# Patient Record
Sex: Female | Born: 1960 | Race: White | Hispanic: No | Marital: Married | Smoking: Current every day smoker
Health system: Southern US, Community
[De-identification: ages and names within clinical notes are randomized; demographics above are authoritative.]

## PROBLEM LIST (undated history)

## (undated) DIAGNOSIS — K219 Gastro-esophageal reflux disease without esophagitis: Secondary | ICD-10-CM

## (undated) DIAGNOSIS — I8390 Asymptomatic varicose veins of unspecified lower extremity: Secondary | ICD-10-CM

## (undated) DIAGNOSIS — I83813 Varicose veins of bilateral lower extremities with pain: Secondary | ICD-10-CM

## (undated) HISTORY — DX: Varicose veins of bilateral lower extremities with pain: I83.813

## (undated) HISTORY — DX: Gastro-esophageal reflux disease without esophagitis: K21.9

## (undated) HISTORY — DX: Asymptomatic varicose veins of unspecified lower extremity: I83.90

---

## 1984-10-27 HISTORY — PX: TUBAL LIGATION: SHX77

## 2000-10-27 HISTORY — PX: BREAST BIOPSY: SHX20

## 2005-02-18 ENCOUNTER — Encounter: Admission: RE | Admit: 2005-02-18 | Discharge: 2005-02-18 | Payer: Self-pay | Admitting: Family Medicine

## 2013-10-06 HISTORY — PX: COLONOSCOPY: SHX174

## 2015-05-09 ENCOUNTER — Other Ambulatory Visit: Payer: Self-pay | Admitting: *Deleted

## 2015-05-09 DIAGNOSIS — I83813 Varicose veins of bilateral lower extremities with pain: Secondary | ICD-10-CM

## 2015-06-19 DIAGNOSIS — I83813 Varicose veins of bilateral lower extremities with pain: Secondary | ICD-10-CM | POA: Insufficient documentation

## 2015-08-09 ENCOUNTER — Encounter: Payer: Self-pay | Admitting: Vascular Surgery

## 2015-08-13 ENCOUNTER — Ambulatory Visit (HOSPITAL_COMMUNITY)
Admission: RE | Admit: 2015-08-13 | Discharge: 2015-08-13 | Disposition: A | Discharge status: Home / Self Care | Payer: BLUE CROSS/BLUE SHIELD | Source: Ambulatory Visit | Attending: Vascular Surgery | Admitting: Vascular Surgery

## 2015-08-13 ENCOUNTER — Encounter: Payer: Self-pay | Admitting: Vascular Surgery

## 2015-08-13 ENCOUNTER — Ambulatory Visit (INDEPENDENT_AMBULATORY_CARE_PROVIDER_SITE_OTHER): Payer: BLUE CROSS/BLUE SHIELD | Admitting: Vascular Surgery

## 2015-08-13 VITALS — BP 125/81 | HR 68 | Temp 97.8°F | Resp 14 | Ht 64.0 in | Wt 131.0 lb

## 2015-08-13 DIAGNOSIS — I8393 Asymptomatic varicose veins of bilateral lower extremities: Secondary | ICD-10-CM | POA: Diagnosis not present

## 2015-08-13 DIAGNOSIS — I83813 Varicose veins of bilateral lower extremities with pain: Secondary | ICD-10-CM | POA: Insufficient documentation

## 2015-08-13 NOTE — Progress Notes (Signed)
Subjective:     Patient ID: Victoria Blevins, female   DOB: 1961-02-14, 54 y.o.   MRN: [id number]  HPI this 54 year old female is evaluated for bilateral varicose veins left worse than right. She describes restless legs at night where she cannot get comfortable. She does not have discomfort when she is walking or during the daytime. She denies history of DVT, thrombophlebitis, stasis ulcers, or bleeding. She has had some small spider veins injected in the past with good result in 1999. She does not whirl elastic compression stockings. She is able to ambulate long distances without difficulty. Left leg is slightly worse than the right.  Past Medical History  Diagnosis Date  . GERD (gastroesophageal reflux disease)   . Varicose veins of both lower extremities with pain   . Varicose veins     Social History  Substance Use Topics  . Smoking status: Current Every Day Smoker    Types: Cigarettes  . Smokeless tobacco: Never Used  . Alcohol Use: No    Family History  Problem Relation Age of Onset  . Heart attack Father 4260  . Diabetes Mother   . Arthritis Mother   . Hypertension Mother     No Known Allergies   Current outpatient prescriptions:  .  norethindrone (AYGESTIN) 5 MG tablet, Take 5 mg by mouth daily., Disp: , Rfl:  .  ranitidine (ZANTAC) 150 MG tablet, Take 150 mg by mouth 2 (two) times daily., Disp: , Rfl:   Filed Vitals:   08/13/15 1421  BP: 125/81  Pulse: 68  Temp: 97.8 F (36.6 C)  Resp: 14  Height: 5\' 4"  (1.626 m)  Weight: 131 lb (59.421 kg)  SpO2: 98%    Body mass index is 22.47 kg/(m^2).           Review of Systems denies chest pain, dyspnea on exertion, PND, orthopnea, claudication     Objective:   Physical Exam BP 125/81 mmHg  Pulse 68  Temp(Src) 97.8 F (36.6 C)  Resp 14  Ht 5\' 4"  (1.626 m)  Wt 131 lb (59.421 kg)  BMI 22.47 kg/m2  SpO2 98%  Gen.-alert and oriented x3 in no apparent distress HEENT normal for age Lungs no rhonchi or  wheezing Cardiovascular regular rhythm no murmurs carotid pulses 3+ palpable no bruits audible Abdomen soft nontender no palpable masses Musculoskeletal free of  major deformities Skin clear -no rashes Neurologic normal Lower extremities 3+ femoral and dorsalis pedis pulses palpable bilaterally with no edema A few small spider veins noted in the posterior calf and distal thigh areas left worse than right. No hyperpigmentation or ulceration noted no bulging varicosities noted.  Today I ordered bilateral venous duplex exam which reviewed and interpreted. There is no DVT. There is some reflux in the great saphenous vein bilaterally in the mid thigh and some at the saphenofemoral junction on the right. There is no DVT and there is some bilateral deep vein reflux       Assessment:     #1 bilateral spider veins-mild #2 no evidence of significant gross reflux in bilateral great saphenous veins #3 nocturnal leg discomfort-etiology unknown    Plan:     Have discussed possible sclerotherapy of the spiders veins if the patient desires. Have told her that her symptoms are not related to her arterial or venous disease No indication for any significant intervention and arterial or venous system.

## 2015-10-04 ENCOUNTER — Encounter: Payer: Self-pay | Admitting: *Deleted

## 2015-10-10 ENCOUNTER — Ambulatory Visit (INDEPENDENT_AMBULATORY_CARE_PROVIDER_SITE_OTHER): Payer: BLUE CROSS/BLUE SHIELD | Admitting: *Deleted

## 2015-10-10 DIAGNOSIS — I83893 Varicose veins of bilateral lower extremities with other complications: Secondary | ICD-10-CM

## 2015-10-10 NOTE — Progress Notes (Signed)
X=.3% Sotradecol administered with a 27g butterfly.  Patient received a total of 6cc.  Pt only wanted me to use one syringe. Probably treated 30%. Suspect there is reflux-discussed this with her-she wanted to proceed with sclero. Easy access. Tol well. Follow prn.  Photos: Yes.    Compression stockings applied: Yes.

## 2015-10-15 ENCOUNTER — Encounter: Payer: Self-pay | Admitting: Vascular Surgery

## 2016-12-25 DIAGNOSIS — Z0001 Encounter for general adult medical examination with abnormal findings: Secondary | ICD-10-CM | POA: Diagnosis not present

## 2016-12-29 DIAGNOSIS — Z0000 Encounter for general adult medical examination without abnormal findings: Secondary | ICD-10-CM | POA: Diagnosis not present

## 2016-12-29 DIAGNOSIS — Z716 Tobacco abuse counseling: Secondary | ICD-10-CM | POA: Diagnosis not present

## 2016-12-29 DIAGNOSIS — Z23 Encounter for immunization: Secondary | ICD-10-CM | POA: Diagnosis not present

## 2017-03-03 DIAGNOSIS — Z23 Encounter for immunization: Secondary | ICD-10-CM | POA: Diagnosis not present

## 2017-03-17 DIAGNOSIS — Z1231 Encounter for screening mammogram for malignant neoplasm of breast: Secondary | ICD-10-CM | POA: Diagnosis not present

## 2017-12-10 DIAGNOSIS — J0101 Acute recurrent maxillary sinusitis: Secondary | ICD-10-CM | POA: Diagnosis not present

## 2017-12-10 DIAGNOSIS — Z6824 Body mass index (BMI) 24.0-24.9, adult: Secondary | ICD-10-CM | POA: Diagnosis not present

## 2017-12-28 DIAGNOSIS — Z0000 Encounter for general adult medical examination without abnormal findings: Secondary | ICD-10-CM | POA: Diagnosis not present

## 2018-01-04 DIAGNOSIS — Z23 Encounter for immunization: Secondary | ICD-10-CM | POA: Diagnosis not present

## 2018-01-04 DIAGNOSIS — E782 Mixed hyperlipidemia: Secondary | ICD-10-CM | POA: Diagnosis not present

## 2018-01-04 DIAGNOSIS — Z9189 Other specified personal risk factors, not elsewhere classified: Secondary | ICD-10-CM | POA: Diagnosis not present

## 2018-01-04 DIAGNOSIS — Z6825 Body mass index (BMI) 25.0-25.9, adult: Secondary | ICD-10-CM | POA: Diagnosis not present

## 2018-01-04 DIAGNOSIS — G2581 Restless legs syndrome: Secondary | ICD-10-CM | POA: Diagnosis not present

## 2018-01-04 DIAGNOSIS — Z0001 Encounter for general adult medical examination with abnormal findings: Secondary | ICD-10-CM | POA: Diagnosis not present

## 2018-01-04 DIAGNOSIS — F17210 Nicotine dependence, cigarettes, uncomplicated: Secondary | ICD-10-CM | POA: Diagnosis not present

## 2018-01-04 DIAGNOSIS — Z1212 Encounter for screening for malignant neoplasm of rectum: Secondary | ICD-10-CM | POA: Diagnosis not present

## 2018-01-04 DIAGNOSIS — K219 Gastro-esophageal reflux disease without esophagitis: Secondary | ICD-10-CM | POA: Diagnosis not present

## 2018-06-14 DIAGNOSIS — F17210 Nicotine dependence, cigarettes, uncomplicated: Secondary | ICD-10-CM | POA: Diagnosis not present

## 2018-06-14 DIAGNOSIS — Z1389 Encounter for screening for other disorder: Secondary | ICD-10-CM | POA: Diagnosis not present

## 2018-06-14 DIAGNOSIS — E782 Mixed hyperlipidemia: Secondary | ICD-10-CM | POA: Diagnosis not present

## 2018-06-14 DIAGNOSIS — Z1331 Encounter for screening for depression: Secondary | ICD-10-CM | POA: Diagnosis not present

## 2018-06-14 DIAGNOSIS — K219 Gastro-esophageal reflux disease without esophagitis: Secondary | ICD-10-CM | POA: Diagnosis not present

## 2018-06-17 DIAGNOSIS — Z1231 Encounter for screening mammogram for malignant neoplasm of breast: Secondary | ICD-10-CM | POA: Diagnosis not present

## 2018-08-04 DIAGNOSIS — Z23 Encounter for immunization: Secondary | ICD-10-CM | POA: Diagnosis not present

## 2019-01-10 DIAGNOSIS — Z0001 Encounter for general adult medical examination with abnormal findings: Secondary | ICD-10-CM | POA: Diagnosis not present

## 2019-01-11 DIAGNOSIS — Z6824 Body mass index (BMI) 24.0-24.9, adult: Secondary | ICD-10-CM | POA: Diagnosis not present

## 2019-01-11 DIAGNOSIS — Z1212 Encounter for screening for malignant neoplasm of rectum: Secondary | ICD-10-CM | POA: Diagnosis not present

## 2019-01-11 DIAGNOSIS — Z0001 Encounter for general adult medical examination with abnormal findings: Secondary | ICD-10-CM | POA: Diagnosis not present

## 2019-01-11 DIAGNOSIS — F17210 Nicotine dependence, cigarettes, uncomplicated: Secondary | ICD-10-CM | POA: Diagnosis not present

## 2019-01-11 DIAGNOSIS — E782 Mixed hyperlipidemia: Secondary | ICD-10-CM | POA: Diagnosis not present

## 2019-01-11 DIAGNOSIS — K219 Gastro-esophageal reflux disease without esophagitis: Secondary | ICD-10-CM | POA: Diagnosis not present

## 2019-01-11 DIAGNOSIS — M545 Low back pain: Secondary | ICD-10-CM | POA: Diagnosis not present

## 2019-06-20 DIAGNOSIS — Z1231 Encounter for screening mammogram for malignant neoplasm of breast: Secondary | ICD-10-CM | POA: Diagnosis not present

## 2019-09-28 DIAGNOSIS — Z03818 Encounter for observation for suspected exposure to other biological agents ruled out: Secondary | ICD-10-CM | POA: Diagnosis not present

## 2020-12-17 ENCOUNTER — Other Ambulatory Visit (HOSPITAL_COMMUNITY): Payer: Self-pay | Admitting: Family Medicine

## 2020-12-17 DIAGNOSIS — Z1231 Encounter for screening mammogram for malignant neoplasm of breast: Secondary | ICD-10-CM

## 2020-12-31 ENCOUNTER — Other Ambulatory Visit: Payer: Self-pay

## 2020-12-31 ENCOUNTER — Ambulatory Visit (HOSPITAL_COMMUNITY)
Admission: RE | Admit: 2020-12-31 | Discharge: 2020-12-31 | Disposition: A | Discharge status: Home / Self Care | Payer: 59 | Source: Ambulatory Visit | Attending: Family Medicine | Admitting: Family Medicine

## 2020-12-31 DIAGNOSIS — Z1231 Encounter for screening mammogram for malignant neoplasm of breast: Secondary | ICD-10-CM | POA: Insufficient documentation

## 2021-02-28 ENCOUNTER — Other Ambulatory Visit (HOSPITAL_BASED_OUTPATIENT_CLINIC_OR_DEPARTMENT_OTHER): Payer: Self-pay

## 2021-03-19 ENCOUNTER — Other Ambulatory Visit (HOSPITAL_COMMUNITY): Payer: Self-pay | Admitting: Family Medicine

## 2021-03-19 DIAGNOSIS — F17209 Nicotine dependence, unspecified, with unspecified nicotine-induced disorders: Secondary | ICD-10-CM

## 2021-04-02 ENCOUNTER — Ambulatory Visit (HOSPITAL_COMMUNITY)
Admission: RE | Admit: 2021-04-02 | Discharge: 2021-04-02 | Disposition: A | Discharge status: Home / Self Care | Payer: 59 | Source: Ambulatory Visit | Attending: Family Medicine | Admitting: Family Medicine

## 2021-04-02 ENCOUNTER — Other Ambulatory Visit: Payer: Self-pay

## 2021-04-02 DIAGNOSIS — F17209 Nicotine dependence, unspecified, with unspecified nicotine-induced disorders: Secondary | ICD-10-CM | POA: Diagnosis present

## 2021-08-30 ENCOUNTER — Encounter: Payer: Self-pay | Admitting: Internal Medicine

## 2021-11-06 DIAGNOSIS — R69 Illness, unspecified: Secondary | ICD-10-CM | POA: Diagnosis not present

## 2021-11-06 DIAGNOSIS — M545 Low back pain, unspecified: Secondary | ICD-10-CM | POA: Diagnosis not present

## 2021-11-06 DIAGNOSIS — Z6826 Body mass index (BMI) 26.0-26.9, adult: Secondary | ICD-10-CM | POA: Diagnosis not present

## 2022-02-04 DIAGNOSIS — R300 Dysuria: Secondary | ICD-10-CM | POA: Diagnosis not present

## 2022-02-04 DIAGNOSIS — Z6825 Body mass index (BMI) 25.0-25.9, adult: Secondary | ICD-10-CM | POA: Diagnosis not present

## 2022-02-04 DIAGNOSIS — R69 Illness, unspecified: Secondary | ICD-10-CM | POA: Diagnosis not present

## 2022-02-19 ENCOUNTER — Other Ambulatory Visit (HOSPITAL_COMMUNITY): Payer: Self-pay | Admitting: Family Medicine

## 2022-02-19 DIAGNOSIS — Z1231 Encounter for screening mammogram for malignant neoplasm of breast: Secondary | ICD-10-CM

## 2022-02-24 ENCOUNTER — Ambulatory Visit (HOSPITAL_COMMUNITY)
Admission: RE | Admit: 2022-02-24 | Discharge: 2022-02-24 | Disposition: A | Discharge status: Home / Self Care | Payer: 59 | Source: Ambulatory Visit | Attending: Family Medicine | Admitting: Family Medicine

## 2022-02-24 DIAGNOSIS — Z1231 Encounter for screening mammogram for malignant neoplasm of breast: Secondary | ICD-10-CM | POA: Diagnosis not present

## 2022-03-21 DIAGNOSIS — R5383 Other fatigue: Secondary | ICD-10-CM | POA: Diagnosis not present

## 2022-03-21 DIAGNOSIS — K219 Gastro-esophageal reflux disease without esophagitis: Secondary | ICD-10-CM | POA: Diagnosis not present

## 2022-03-21 DIAGNOSIS — E7849 Other hyperlipidemia: Secondary | ICD-10-CM | POA: Diagnosis not present

## 2022-03-21 DIAGNOSIS — E1165 Type 2 diabetes mellitus with hyperglycemia: Secondary | ICD-10-CM | POA: Diagnosis not present

## 2022-03-21 DIAGNOSIS — E782 Mixed hyperlipidemia: Secondary | ICD-10-CM | POA: Diagnosis not present

## 2022-03-21 DIAGNOSIS — I10 Essential (primary) hypertension: Secondary | ICD-10-CM | POA: Diagnosis not present

## 2022-03-21 DIAGNOSIS — E039 Hypothyroidism, unspecified: Secondary | ICD-10-CM | POA: Diagnosis not present

## 2022-03-21 DIAGNOSIS — Z1159 Encounter for screening for other viral diseases: Secondary | ICD-10-CM | POA: Diagnosis not present

## 2022-03-27 DIAGNOSIS — M545 Low back pain, unspecified: Secondary | ICD-10-CM | POA: Diagnosis not present

## 2022-03-27 DIAGNOSIS — K2100 Gastro-esophageal reflux disease with esophagitis, without bleeding: Secondary | ICD-10-CM | POA: Diagnosis not present

## 2022-03-27 DIAGNOSIS — Z1212 Encounter for screening for malignant neoplasm of rectum: Secondary | ICD-10-CM | POA: Diagnosis not present

## 2022-03-27 DIAGNOSIS — G2581 Restless legs syndrome: Secondary | ICD-10-CM | POA: Diagnosis not present

## 2022-03-27 DIAGNOSIS — R69 Illness, unspecified: Secondary | ICD-10-CM | POA: Diagnosis not present

## 2022-03-27 DIAGNOSIS — E782 Mixed hyperlipidemia: Secondary | ICD-10-CM | POA: Diagnosis not present

## 2022-03-27 DIAGNOSIS — Z9189 Other specified personal risk factors, not elsewhere classified: Secondary | ICD-10-CM | POA: Diagnosis not present

## 2022-03-27 DIAGNOSIS — R300 Dysuria: Secondary | ICD-10-CM | POA: Diagnosis not present

## 2022-03-27 DIAGNOSIS — Z23 Encounter for immunization: Secondary | ICD-10-CM | POA: Diagnosis not present

## 2022-03-27 DIAGNOSIS — J449 Chronic obstructive pulmonary disease, unspecified: Secondary | ICD-10-CM | POA: Diagnosis not present

## 2022-03-27 DIAGNOSIS — I700 Atherosclerosis of aorta: Secondary | ICD-10-CM | POA: Diagnosis not present

## 2022-03-27 DIAGNOSIS — Z0001 Encounter for general adult medical examination with abnormal findings: Secondary | ICD-10-CM | POA: Diagnosis not present

## 2022-04-01 ENCOUNTER — Other Ambulatory Visit: Payer: Self-pay | Admitting: Family Medicine

## 2022-04-01 DIAGNOSIS — F17209 Nicotine dependence, unspecified, with unspecified nicotine-induced disorders: Secondary | ICD-10-CM

## 2022-07-31 ENCOUNTER — Other Ambulatory Visit: Payer: Self-pay

## 2022-07-31 DIAGNOSIS — I83813 Varicose veins of bilateral lower extremities with pain: Secondary | ICD-10-CM

## 2022-08-05 ENCOUNTER — Other Ambulatory Visit: Payer: Self-pay | Admitting: *Deleted

## 2022-08-05 DIAGNOSIS — I83813 Varicose veins of bilateral lower extremities with pain: Secondary | ICD-10-CM

## 2022-08-13 ENCOUNTER — Ambulatory Visit: Payer: 59 | Admitting: Physician Assistant

## 2022-08-13 ENCOUNTER — Ambulatory Visit (HOSPITAL_COMMUNITY)
Admission: RE | Admit: 2022-08-13 | Discharge: 2022-08-13 | Disposition: A | Discharge status: Home / Self Care | Payer: 59 | Source: Ambulatory Visit | Attending: Vascular Surgery | Admitting: Vascular Surgery

## 2022-08-13 ENCOUNTER — Other Ambulatory Visit: Payer: Self-pay

## 2022-08-13 ENCOUNTER — Encounter: Payer: Self-pay | Admitting: Physician Assistant

## 2022-08-13 VITALS — BP 141/73 | HR 61 | Temp 97.6°F | Ht 63.0 in | Wt 149.5 lb

## 2022-08-13 DIAGNOSIS — I83813 Varicose veins of bilateral lower extremities with pain: Secondary | ICD-10-CM | POA: Insufficient documentation

## 2022-08-13 DIAGNOSIS — I872 Venous insufficiency (chronic) (peripheral): Secondary | ICD-10-CM | POA: Diagnosis not present

## 2022-08-13 NOTE — Progress Notes (Addendum)
[facility] [place]     History of Present Illness  Victoria Blevins is a 61 y.o. female who presents with chief complaint: swollen leg.  Patient notes, onset of swelling since 2016, associated with prolonged sitting and standing.  The patient has had no history of DVT, positive history of varicose vein B LE, no history of venous stasis ulcers, no history of  Lymphedema and no history of skin changes in lower legs.  There is unknown family history of venous disorders.  The patient has used thigh high 20-30 mm hg compression stockings in the past.for 4 months now.  She has been followed by [facility].  She states they were not in her insurance net work and she was referred here.   She was previously seen by Dr. Kellie Simmering 08/12/22 with edema and varicose veins.  She was placed in compression at that visit and under went     She noted heavy achy pain in the left > right LE at the end of her work day.  She denies claudication, rest pain or non healing foot wounds. She is medically managed on Crestor.     Past Medical History:  Diagnosis Date   GERD (gastroesophageal reflux disease)    Varicose veins    Varicose veins of both lower extremities with pain     Past Surgical History:  Procedure Laterality Date   BREAST BIOPSY Left 2002   Dr. Lindalou Hose   COLONOSCOPY  10/06/2013   Dr. Ladona Horns   TUBAL LIGATION  1986    Social History   Socioeconomic History   Marital status: Married    Spouse name: Not on file   Number of children: Not on file   Years of education: Not on file   Highest education level: Not on file  Occupational History   Not on file  Tobacco Use   Smoking status: Every Day    Packs/day: 0.25    Types: Cigarettes   Smokeless tobacco: Never  Substance and Sexual Activity   Alcohol use: No    Alcohol/week: 0.0 standard drinks of alcohol   Drug use: No   Sexual activity: Not on file  Other Topics Concern   Not on file  Social History Narrative    Not on file   Social Determinants of Health   Financial Resource Strain: Not on file  Food Insecurity: Not on file  Transportation Needs: Not on file  Physical Activity: Not on file  Stress: Not on file  Social Connections: Not on file  Intimate Partner Violence: Not on file    Family History  Problem Relation Age of Onset   Heart attack Father 62   Diabetes Mother    Arthritis Mother    Hypertension Mother     Current Outpatient Medications on File Prior to Visit  Medication Sig Dispense Refill   rosuvastatin (CRESTOR) 5 MG tablet Take 5 mg by mouth daily.     No current facility-administered medications on file prior to visit.    Allergies as of 08/13/2022   (No Known Allergies)     ROS:   General:  No weight loss, Fever, chills  HEENT: No recent headaches, no nasal bleeding, no visual changes, no sore throat  Neurologic: No dizziness, blackouts, seizures. No recent symptoms of stroke or mini- stroke. No recent episodes of slurred speech, or temporary blindness.  Cardiac: No recent episodes of chest pain/pressure, no shortness of breath at rest.  No shortness of breath with  exertion.  Denies history of atrial fibrillation or irregular heartbeat  Vascular: No history of rest pain in feet.  No history of claudication.  No history of non-healing ulcer, No history of DVT   Pulmonary: No home oxygen, no productive cough, no hemoptysis,  No asthma or wheezing  Musculoskeletal:  [ ]  Arthritis, [ ]  Low back pain,  [ ]  Joint pain  Hematologic:No history of hypercoagulable state.  No history of easy bleeding.  No history of anemia  Gastrointestinal: No hematochezia or melena,  No gastroesophageal reflux, no trouble swallowing  Urinary: [ ]  chronic Kidney disease, [ ]  on HD - [ ]  MWF or [ ]  TTHS, [ ]  Burning with urination, [ ]  Frequent urination, [ ]  Difficulty urinating;   Skin: No rashes  Psychological: No history of anxiety,  No history of depression  Physical  Examination  Vitals:   08/13/22 1113  BP: (!) 141/73  Pulse: 61  Temp: 97.6 F (36.4 C)  TempSrc: Temporal  SpO2: 98%  Weight: 149 lb 8 oz (67.8 kg)  Height: 5\' 3"  (1.6 m)    Body mass index is 26.48 kg/m.  General:  Alert and oriented, no acute distress HEENT: Normal Neck: No bruit or JVD Pulmonary: Clear to auscultation bilaterally Cardiac: Regular Rate and Rhythm without murmur Abdomen: Soft, non-tender, non-distended, no mass, no scars Skin: No rash Extremity Pulses:   radial,  femoral, dorsalis pedis, posterior tibial pulses bilaterally Musculoskeletal: No deformity or edema  Neurologic: Upper and lower extremity motor 5/5 and symmetric  DATA: +--------------+---------+------+-----------+------------+--------+  LEFT          Reflux NoRefluxReflux TimeDiameter cmsComments                          Yes                                   +--------------+---------+------+-----------+------------+--------+  CFV           no                                              +--------------+---------+------+-----------+------------+--------+  FV mid        no                                              +--------------+---------+------+-----------+------------+--------+  Popliteal               yes   >1 second                       +--------------+---------+------+-----------+------------+--------+  GSV at SFJ              yes    >500 ms      0.67              +--------------+---------+------+-----------+------------+--------+  GSV prox thighno                            0.27              +--------------+---------+------+-----------+------------+--------+  GSV mid thigh  yes    >500 ms      0.23              +--------------+---------+------+-----------+------------+--------+  GSV dist thigh          yes    >500 ms      0.33              +--------------+---------+------+-----------+------------+--------+   GSV at knee   no                            0.20              +--------------+---------+------+-----------+------------+--------+  GSV prox calf           yes    >500 ms      0.19              +--------------+---------+------+-----------+------------+--------+  GSV mid calf            yes    >500 ms      0.20              +--------------+---------+------+-----------+------------+--------+  SSV Pop Fossa no                            0.26              +--------------+---------+------+-----------+------------+--------+  SSV prox calf no                            0.12              +--------------+---------+------+-----------+------------+--------+  SSV mid calf  no                            0.20              +--------------+---------+------+-----------+------------+--------+  AASV O        no                            0.35              +--------------+---------+------+-----------+------------+--------+          Summary:  Left:  - No evidence of deep vein thrombosis seen in the left lower extremity,  from the common femoral through the popliteal veins.  - No evidence of superficial venous reflux seen in the left short  saphenous vein.  - Venous reflux is noted in the left sapheno-femoral junction.  - Venous reflux is noted in the left greater saphenous vein in the thigh.  - Venous reflux is noted in the left greater saphenous vein in the calf.  - Venous reflux is noted in the left popliteal vein.  - No evidence of reflux in the AASV.     Assessment: Varicose veins - Venous reflux is noted in the left sapheno-femoral junction.  - Venous reflux is noted in the left greater saphenous vein in the thigh.  - Venous reflux is noted in the left greater saphenous vein in the calf.  - Venous reflux is noted in the left popliteal vein.  No DVT noted  Doppler signals PT/DP/Peroneal intact B LE.  No ischemic changes, no claudication or non  healing foot wounds.    Plan: Thigh high compression 20-30 mm hg, exercise, elevation when at rest.  F/U scheduled with Vein clinic to consider laser ablation therapy.  Right LE reflux duplex will be performed at her next f/u appointment.   Roxy Horseman PA-C [facility] [place] Office: [phone]  MD in clinic [place]

## 2022-08-25 ENCOUNTER — Ambulatory Visit
Admission: RE | Admit: 2022-08-25 | Discharge: 2022-08-25 | Disposition: A | Discharge status: Home / Self Care | Payer: 59 | Source: Ambulatory Visit | Attending: Family Medicine | Admitting: Family Medicine

## 2022-08-25 DIAGNOSIS — F17209 Nicotine dependence, unspecified, with unspecified nicotine-induced disorders: Secondary | ICD-10-CM

## 2022-08-25 DIAGNOSIS — R69 Illness, unspecified: Secondary | ICD-10-CM | POA: Diagnosis not present

## 2022-09-10 ENCOUNTER — Ambulatory Visit: Payer: 59 | Admitting: Vascular Surgery

## 2022-09-10 ENCOUNTER — Ambulatory Visit (HOSPITAL_COMMUNITY)
Admission: RE | Admit: 2022-09-10 | Discharge: 2022-09-10 | Disposition: A | Discharge status: Home / Self Care | Payer: 59 | Source: Ambulatory Visit | Attending: Vascular Surgery | Admitting: Vascular Surgery

## 2022-09-10 ENCOUNTER — Encounter: Payer: Self-pay | Admitting: Vascular Surgery

## 2022-09-10 VITALS — BP 144/86 | HR 65 | Temp 98.1°F | Resp 14 | Ht 63.5 in | Wt 152.0 lb

## 2022-09-10 DIAGNOSIS — I872 Venous insufficiency (chronic) (peripheral): Secondary | ICD-10-CM | POA: Diagnosis not present

## 2022-09-10 DIAGNOSIS — I83813 Varicose veins of bilateral lower extremities with pain: Secondary | ICD-10-CM | POA: Insufficient documentation

## 2022-09-10 NOTE — Progress Notes (Signed)
REASON FOR VISIT:   Follow-up of venous insufficiency.  MEDICAL ISSUES:   CHRONIC VENOUS INSUFFICIENCY: This patient has CEAP C1 venous disease (telangiectasias).  She really does not have any significant symptoms related to her venous disease.  She does have some deep and superficial venous reflux bilaterally.  Her superficial venous reflux does not appear to be significant currently as the veins are not significantly dilated.  We have discussed importance of intermittent leg elevation and the proper positioning for this.  I encouraged her to continue to wear her compression stockings when she is on her feet.  We discussed the importance of exercise specifically walking and water aerobics.  I also encouraged her to avoid prolonged sitting and standing.  Currently I do not think she is a candidate for laser ablation on either side.  I have explained that venous disease does tend to gradually progress and that if she develops large varicose veins and symptoms of venous hypertension we can certainly reevaluate.  I will see her back as needed.   HPI:   Victoria Blevins is a pleasant 61 y.o. female who was seen by Lianne Cure, PA on 08/13/2022 with leg swelling.  She had a venous study on the left and comes in today for follow-up visit and study on the right.  On my history, the patient complains of having restless legs at night.  Her symptoms were more significant on the left side.  She has had no symptoms recently.  She denies any recent aching or heaviness in her legs.  She has had no problems with large varicose veins.  She had no previous history of DVT.  She has been wearing her compression stockings some.  She does elevate her legs.  She has undergone some sclerotherapy in the left leg in the remote past.  Past Medical History:  Diagnosis Date   GERD (gastroesophageal reflux disease)    Varicose veins    Varicose veins of both lower extremities with pain     Family History  Problem  Relation Age of Onset   Heart attack Father 71   Diabetes Mother    Arthritis Mother    Hypertension Mother     SOCIAL HISTORY: Social History   Tobacco Use   Smoking status: Every Day    Packs/day: 0.25    Types: Cigarettes   Smokeless tobacco: Never  Substance Use Topics   Alcohol use: No    Alcohol/week: 0.0 standard drinks of alcohol    No Known Allergies  Current Outpatient Medications  Medication Sig Dispense Refill   rosuvastatin (CRESTOR) 5 MG tablet Take 5 mg by mouth daily.     No current facility-administered medications for this visit.    REVIEW OF SYSTEMS:  [X]  denotes positive finding, [ ]  denotes negative finding Cardiac  Comments:  Chest pain or chest pressure:    Shortness of breath upon exertion:    Short of breath when lying flat:    Irregular heart rhythm:        Vascular    Pain in calf, thigh, or hip brought on by ambulation:    Pain in feet at night that wakes you up from your sleep:     Blood clot in your veins:    Leg swelling:         Pulmonary    Oxygen at home:    Productive cough:     Wheezing:         Neurologic  Sudden weakness in arms or legs:     Sudden numbness in arms or legs:     Sudden onset of difficulty speaking or slurred speech:    Temporary loss of vision in one eye:     Problems with dizziness:         Gastrointestinal    Blood in stool:     Vomited blood:         Genitourinary    Burning when urinating:     Blood in urine:        Psychiatric    Major depression:         Hematologic    Bleeding problems:    Problems with blood clotting too easily:        Skin    Rashes or ulcers:        Constitutional    Fever or chills:     PHYSICAL EXAM:   Vitals:   09/10/22 1419  BP: (!) 144/86  Pulse: 65  Resp: 14  Temp: 98.1 F (36.7 C)  TempSrc: Temporal  SpO2: 97%  Weight: 152 lb (68.9 kg)  Height: 5' 3.5" (1.613 m)    GENERAL: The patient is a well-nourished female, in no acute distress. The  vital signs are documented above. CARDIAC: There is a regular rate and rhythm.  VASCULAR: I do not detect carotid bruits. She has palpable pedal pulses. She has some telangiectasias bilaterally but no varicose veins.  She has no leg swelling or hyperpigmentation. PULMONARY: There is good air exchange bilaterally without wheezing or rales. ABDOMEN: Soft and non-tender with normal pitched bowel sounds.  MUSCULOSKELETAL: There are no major deformities or cyanosis. NEUROLOGIC: No focal weakness or paresthesias are detected. SKIN: There are no ulcers or rashes noted. PSYCHIATRIC: The patient has a normal affect.  DATA:    VENOUS DUPLEX RIGHT LOWER EXTREMITY: I have independently interpreted her venous duplex scan of the right lower extremity today.  There was no DVT on the right.  There was deep venous reflux involving the common femoral vein and popliteal vein.  There was superficial venous reflux in the great saphenous vein down to the proximal calf however the vein was fairly small with diameters ranging from 2.6-3.7 mm.  The anterior accessory saphenous vein had reflux but was not dilated.  It was only 2.5 mm in diameter.  The results are summarized on the diagram below.    The patient had a venous duplex scan of the left lower extremity back in October.  The results of this study are summarized on the diagram below.       Waverly Ferrari [facility] [phone]

## 2022-09-29 DIAGNOSIS — J449 Chronic obstructive pulmonary disease, unspecified: Secondary | ICD-10-CM | POA: Diagnosis not present

## 2022-09-29 DIAGNOSIS — Z23 Encounter for immunization: Secondary | ICD-10-CM | POA: Diagnosis not present

## 2022-09-29 DIAGNOSIS — K2100 Gastro-esophageal reflux disease with esophagitis, without bleeding: Secondary | ICD-10-CM | POA: Diagnosis not present

## 2022-09-29 DIAGNOSIS — F17210 Nicotine dependence, cigarettes, uncomplicated: Secondary | ICD-10-CM | POA: Diagnosis not present

## 2022-09-29 DIAGNOSIS — R69 Illness, unspecified: Secondary | ICD-10-CM | POA: Diagnosis not present

## 2022-09-29 DIAGNOSIS — Z6826 Body mass index (BMI) 26.0-26.9, adult: Secondary | ICD-10-CM | POA: Diagnosis not present

## 2022-09-29 DIAGNOSIS — R030 Elevated blood-pressure reading, without diagnosis of hypertension: Secondary | ICD-10-CM | POA: Diagnosis not present

## 2022-09-29 DIAGNOSIS — M545 Low back pain, unspecified: Secondary | ICD-10-CM | POA: Diagnosis not present

## 2022-09-29 DIAGNOSIS — E7849 Other hyperlipidemia: Secondary | ICD-10-CM | POA: Diagnosis not present

## 2022-09-29 DIAGNOSIS — G2581 Restless legs syndrome: Secondary | ICD-10-CM | POA: Diagnosis not present

## 2022-09-29 DIAGNOSIS — I700 Atherosclerosis of aorta: Secondary | ICD-10-CM | POA: Diagnosis not present

## 2022-12-31 DIAGNOSIS — F17210 Nicotine dependence, cigarettes, uncomplicated: Secondary | ICD-10-CM | POA: Diagnosis not present

## 2022-12-31 DIAGNOSIS — M25572 Pain in left ankle and joints of left foot: Secondary | ICD-10-CM | POA: Diagnosis not present

## 2022-12-31 DIAGNOSIS — Z6827 Body mass index (BMI) 27.0-27.9, adult: Secondary | ICD-10-CM | POA: Diagnosis not present

## 2022-12-31 DIAGNOSIS — R030 Elevated blood-pressure reading, without diagnosis of hypertension: Secondary | ICD-10-CM | POA: Diagnosis not present

## 2023-02-12 ENCOUNTER — Other Ambulatory Visit (HOSPITAL_COMMUNITY): Payer: Self-pay | Admitting: Family Medicine

## 2023-02-12 DIAGNOSIS — Z1231 Encounter for screening mammogram for malignant neoplasm of breast: Secondary | ICD-10-CM

## 2023-02-26 ENCOUNTER — Ambulatory Visit (HOSPITAL_COMMUNITY)
Admission: RE | Admit: 2023-02-26 | Discharge: 2023-02-26 | Disposition: A | Discharge status: Home / Self Care | Payer: 59 | Source: Ambulatory Visit | Attending: Family Medicine | Admitting: Family Medicine

## 2023-02-26 ENCOUNTER — Encounter (HOSPITAL_COMMUNITY): Payer: Self-pay

## 2023-02-26 DIAGNOSIS — Z1231 Encounter for screening mammogram for malignant neoplasm of breast: Secondary | ICD-10-CM | POA: Diagnosis not present

## 2023-04-06 DIAGNOSIS — E7849 Other hyperlipidemia: Secondary | ICD-10-CM | POA: Diagnosis not present

## 2023-04-06 DIAGNOSIS — I10 Essential (primary) hypertension: Secondary | ICD-10-CM | POA: Diagnosis not present

## 2023-04-06 DIAGNOSIS — E039 Hypothyroidism, unspecified: Secondary | ICD-10-CM | POA: Diagnosis not present

## 2023-04-06 DIAGNOSIS — J449 Chronic obstructive pulmonary disease, unspecified: Secondary | ICD-10-CM | POA: Diagnosis not present

## 2023-04-06 DIAGNOSIS — Z0001 Encounter for general adult medical examination with abnormal findings: Secondary | ICD-10-CM | POA: Diagnosis not present

## 2023-04-06 DIAGNOSIS — K2100 Gastro-esophageal reflux disease with esophagitis, without bleeding: Secondary | ICD-10-CM | POA: Diagnosis not present

## 2023-04-06 DIAGNOSIS — Z131 Encounter for screening for diabetes mellitus: Secondary | ICD-10-CM | POA: Diagnosis not present

## 2023-04-06 DIAGNOSIS — F17210 Nicotine dependence, cigarettes, uncomplicated: Secondary | ICD-10-CM | POA: Diagnosis not present

## 2023-04-13 DIAGNOSIS — F17210 Nicotine dependence, cigarettes, uncomplicated: Secondary | ICD-10-CM | POA: Diagnosis not present

## 2023-04-13 DIAGNOSIS — E7849 Other hyperlipidemia: Secondary | ICD-10-CM | POA: Diagnosis not present

## 2023-04-13 DIAGNOSIS — Z0001 Encounter for general adult medical examination with abnormal findings: Secondary | ICD-10-CM | POA: Diagnosis not present

## 2023-04-13 DIAGNOSIS — Z1389 Encounter for screening for other disorder: Secondary | ICD-10-CM | POA: Diagnosis not present

## 2023-04-13 DIAGNOSIS — Z23 Encounter for immunization: Secondary | ICD-10-CM | POA: Diagnosis not present

## 2023-04-13 DIAGNOSIS — E782 Mixed hyperlipidemia: Secondary | ICD-10-CM | POA: Diagnosis not present

## 2023-04-13 DIAGNOSIS — J449 Chronic obstructive pulmonary disease, unspecified: Secondary | ICD-10-CM | POA: Diagnosis not present

## 2023-04-13 DIAGNOSIS — I700 Atherosclerosis of aorta: Secondary | ICD-10-CM | POA: Diagnosis not present

## 2023-04-13 DIAGNOSIS — M545 Low back pain, unspecified: Secondary | ICD-10-CM | POA: Diagnosis not present

## 2023-04-13 DIAGNOSIS — Z1331 Encounter for screening for depression: Secondary | ICD-10-CM | POA: Diagnosis not present

## 2023-04-13 DIAGNOSIS — Z1212 Encounter for screening for malignant neoplasm of rectum: Secondary | ICD-10-CM | POA: Diagnosis not present

## 2023-04-13 DIAGNOSIS — K2100 Gastro-esophageal reflux disease with esophagitis, without bleeding: Secondary | ICD-10-CM | POA: Diagnosis not present

## 2023-04-13 DIAGNOSIS — R030 Elevated blood-pressure reading, without diagnosis of hypertension: Secondary | ICD-10-CM | POA: Diagnosis not present

## 2023-04-13 DIAGNOSIS — Z9189 Other specified personal risk factors, not elsewhere classified: Secondary | ICD-10-CM | POA: Diagnosis not present

## 2023-04-13 DIAGNOSIS — Z6827 Body mass index (BMI) 27.0-27.9, adult: Secondary | ICD-10-CM | POA: Diagnosis not present

## 2023-04-13 DIAGNOSIS — G2581 Restless legs syndrome: Secondary | ICD-10-CM | POA: Diagnosis not present

## 2023-04-16 ENCOUNTER — Other Ambulatory Visit (HOSPITAL_COMMUNITY): Payer: Self-pay | Admitting: Family Medicine

## 2023-04-16 DIAGNOSIS — Z1231 Encounter for screening mammogram for malignant neoplasm of breast: Secondary | ICD-10-CM

## 2023-04-17 ENCOUNTER — Other Ambulatory Visit (HOSPITAL_COMMUNITY): Payer: Self-pay | Admitting: Family Medicine

## 2023-04-17 DIAGNOSIS — Z1231 Encounter for screening mammogram for malignant neoplasm of breast: Secondary | ICD-10-CM

## 2023-06-30 DIAGNOSIS — F17210 Nicotine dependence, cigarettes, uncomplicated: Secondary | ICD-10-CM | POA: Diagnosis not present

## 2023-06-30 DIAGNOSIS — R300 Dysuria: Secondary | ICD-10-CM | POA: Diagnosis not present

## 2023-06-30 DIAGNOSIS — R030 Elevated blood-pressure reading, without diagnosis of hypertension: Secondary | ICD-10-CM | POA: Diagnosis not present

## 2023-06-30 DIAGNOSIS — Z6826 Body mass index (BMI) 26.0-26.9, adult: Secondary | ICD-10-CM | POA: Diagnosis not present

## 2023-08-31 ENCOUNTER — Other Ambulatory Visit: Payer: Self-pay | Admitting: Family Medicine

## 2023-08-31 DIAGNOSIS — F17200 Nicotine dependence, unspecified, uncomplicated: Secondary | ICD-10-CM

## 2023-09-01 DIAGNOSIS — F17210 Nicotine dependence, cigarettes, uncomplicated: Secondary | ICD-10-CM | POA: Diagnosis not present

## 2023-09-01 DIAGNOSIS — J449 Chronic obstructive pulmonary disease, unspecified: Secondary | ICD-10-CM | POA: Diagnosis not present

## 2023-09-01 DIAGNOSIS — Z881 Allergy status to other antibiotic agents status: Secondary | ICD-10-CM | POA: Diagnosis not present

## 2023-09-01 DIAGNOSIS — K219 Gastro-esophageal reflux disease without esophagitis: Secondary | ICD-10-CM | POA: Diagnosis not present

## 2023-09-01 DIAGNOSIS — E785 Hyperlipidemia, unspecified: Secondary | ICD-10-CM | POA: Diagnosis not present

## 2023-09-01 DIAGNOSIS — M545 Low back pain, unspecified: Secondary | ICD-10-CM | POA: Diagnosis not present

## 2023-09-01 DIAGNOSIS — G2581 Restless legs syndrome: Secondary | ICD-10-CM | POA: Diagnosis not present

## 2023-09-01 DIAGNOSIS — G8929 Other chronic pain: Secondary | ICD-10-CM | POA: Diagnosis not present

## 2023-09-01 DIAGNOSIS — Z8249 Family history of ischemic heart disease and other diseases of the circulatory system: Secondary | ICD-10-CM | POA: Diagnosis not present

## 2023-09-01 DIAGNOSIS — Z809 Family history of malignant neoplasm, unspecified: Secondary | ICD-10-CM | POA: Diagnosis not present

## 2023-09-01 DIAGNOSIS — I10 Essential (primary) hypertension: Secondary | ICD-10-CM | POA: Diagnosis not present

## 2023-09-01 DIAGNOSIS — I87309 Chronic venous hypertension (idiopathic) without complications of unspecified lower extremity: Secondary | ICD-10-CM | POA: Diagnosis not present

## 2023-09-11 ENCOUNTER — Ambulatory Visit
Admission: RE | Admit: 2023-09-11 | Discharge: 2023-09-11 | Disposition: A | Discharge status: Home / Self Care | Payer: 59 | Source: Ambulatory Visit | Attending: Family Medicine | Admitting: Family Medicine

## 2023-09-11 DIAGNOSIS — F17200 Nicotine dependence, unspecified, uncomplicated: Secondary | ICD-10-CM

## 2023-09-11 DIAGNOSIS — Z87891 Personal history of nicotine dependence: Secondary | ICD-10-CM | POA: Diagnosis not present

## 2023-10-15 DIAGNOSIS — Z1212 Encounter for screening for malignant neoplasm of rectum: Secondary | ICD-10-CM | POA: Diagnosis not present

## 2023-10-15 DIAGNOSIS — F17210 Nicotine dependence, cigarettes, uncomplicated: Secondary | ICD-10-CM | POA: Diagnosis not present

## 2023-10-15 DIAGNOSIS — Z9189 Other specified personal risk factors, not elsewhere classified: Secondary | ICD-10-CM | POA: Diagnosis not present

## 2023-10-15 DIAGNOSIS — K2100 Gastro-esophageal reflux disease with esophagitis, without bleeding: Secondary | ICD-10-CM | POA: Diagnosis not present

## 2023-10-15 DIAGNOSIS — Z23 Encounter for immunization: Secondary | ICD-10-CM | POA: Diagnosis not present

## 2023-10-15 DIAGNOSIS — R030 Elevated blood-pressure reading, without diagnosis of hypertension: Secondary | ICD-10-CM | POA: Diagnosis not present

## 2023-10-15 DIAGNOSIS — M545 Low back pain, unspecified: Secondary | ICD-10-CM | POA: Diagnosis not present

## 2023-10-15 DIAGNOSIS — E7849 Other hyperlipidemia: Secondary | ICD-10-CM | POA: Diagnosis not present

## 2023-10-15 DIAGNOSIS — G2581 Restless legs syndrome: Secondary | ICD-10-CM | POA: Diagnosis not present

## 2023-10-15 DIAGNOSIS — Z6826 Body mass index (BMI) 26.0-26.9, adult: Secondary | ICD-10-CM | POA: Diagnosis not present

## 2023-10-15 DIAGNOSIS — E782 Mixed hyperlipidemia: Secondary | ICD-10-CM | POA: Diagnosis not present

## 2023-10-15 DIAGNOSIS — J449 Chronic obstructive pulmonary disease, unspecified: Secondary | ICD-10-CM | POA: Diagnosis not present

## 2023-10-15 DIAGNOSIS — I700 Atherosclerosis of aorta: Secondary | ICD-10-CM | POA: Diagnosis not present

## 2023-10-15 DIAGNOSIS — R109 Unspecified abdominal pain: Secondary | ICD-10-CM | POA: Diagnosis not present

## 2023-10-16 ENCOUNTER — Other Ambulatory Visit (HOSPITAL_COMMUNITY): Payer: Self-pay | Admitting: Family Medicine

## 2023-10-16 DIAGNOSIS — R109 Unspecified abdominal pain: Secondary | ICD-10-CM

## 2023-10-22 ENCOUNTER — Ambulatory Visit (HOSPITAL_COMMUNITY)
Admission: RE | Admit: 2023-10-22 | Discharge: 2023-10-22 | Disposition: A | Discharge status: Home / Self Care | Payer: 59 | Source: Ambulatory Visit | Attending: Family Medicine | Admitting: Family Medicine

## 2023-10-22 DIAGNOSIS — R109 Unspecified abdominal pain: Secondary | ICD-10-CM | POA: Insufficient documentation

## 2023-10-22 DIAGNOSIS — R110 Nausea: Secondary | ICD-10-CM | POA: Diagnosis not present

## 2024-04-08 ENCOUNTER — Other Ambulatory Visit (HOSPITAL_COMMUNITY): Payer: Self-pay | Admitting: Family Medicine

## 2024-04-08 DIAGNOSIS — Z1231 Encounter for screening mammogram for malignant neoplasm of breast: Secondary | ICD-10-CM

## 2024-04-09 IMAGING — MG MM DIGITAL SCREENING BILAT W/ TOMO AND CAD
6 of 10 series · 6 of 30 positions shown · non-contrast
Comparison: Previous exam(s).

CLINICAL DATA: Screening.

EXAM:
DIGITAL SCREENING BILATERAL MAMMOGRAM WITH TOMOSYNTHESIS AND CAD
TECHNIQUE: Bilateral screening digital craniocaudal and mediolateral oblique
mammograms were obtained. Bilateral screening digital breast
tomosynthesis was performed. The images were evaluated with
computer-aided detection.

[L CC synth-2D (1 of 2)]
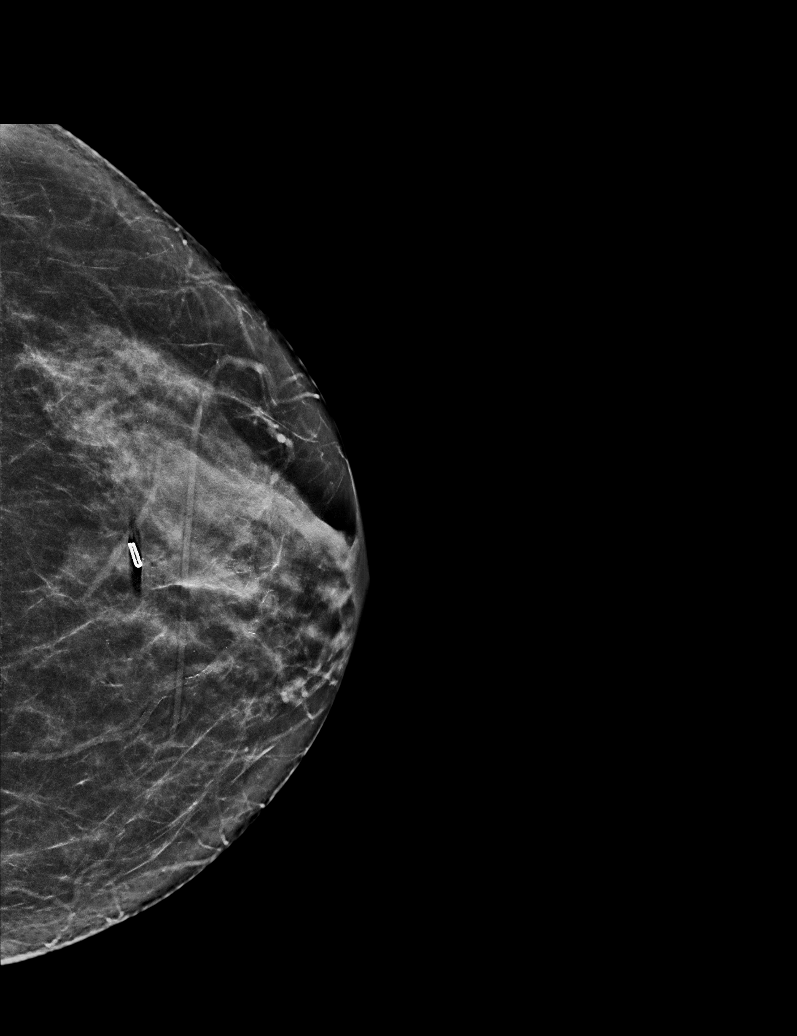

[L CC synth-2D (2 of 2)]
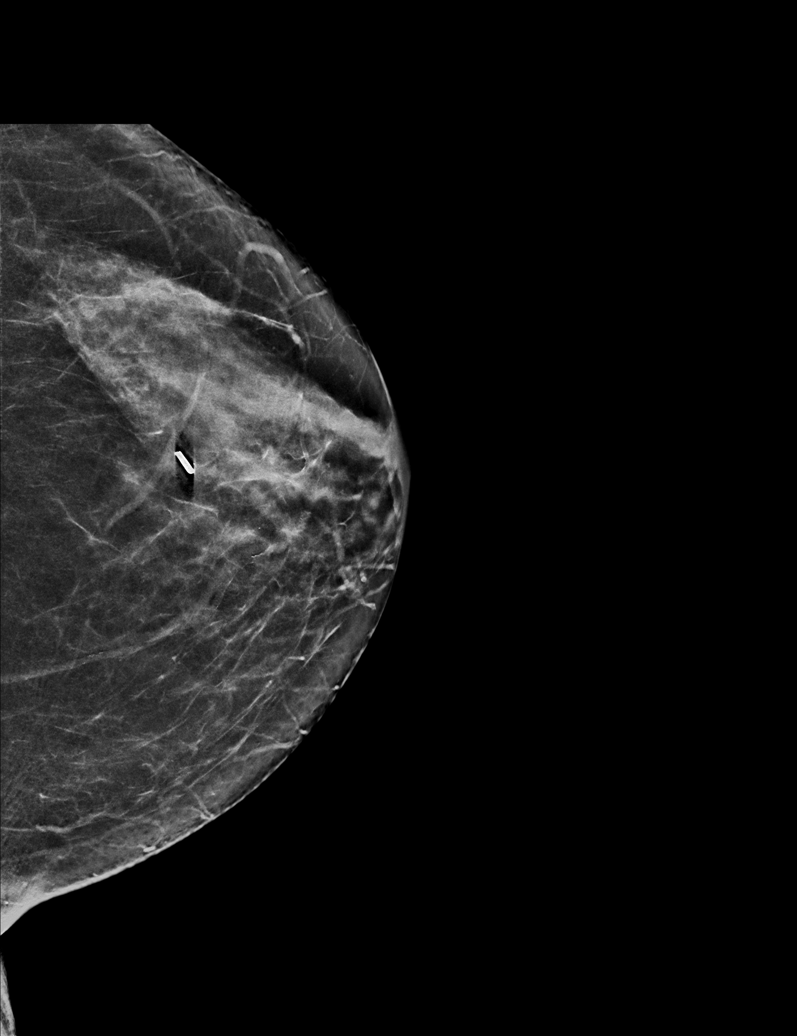

[R MLO synth-2D]
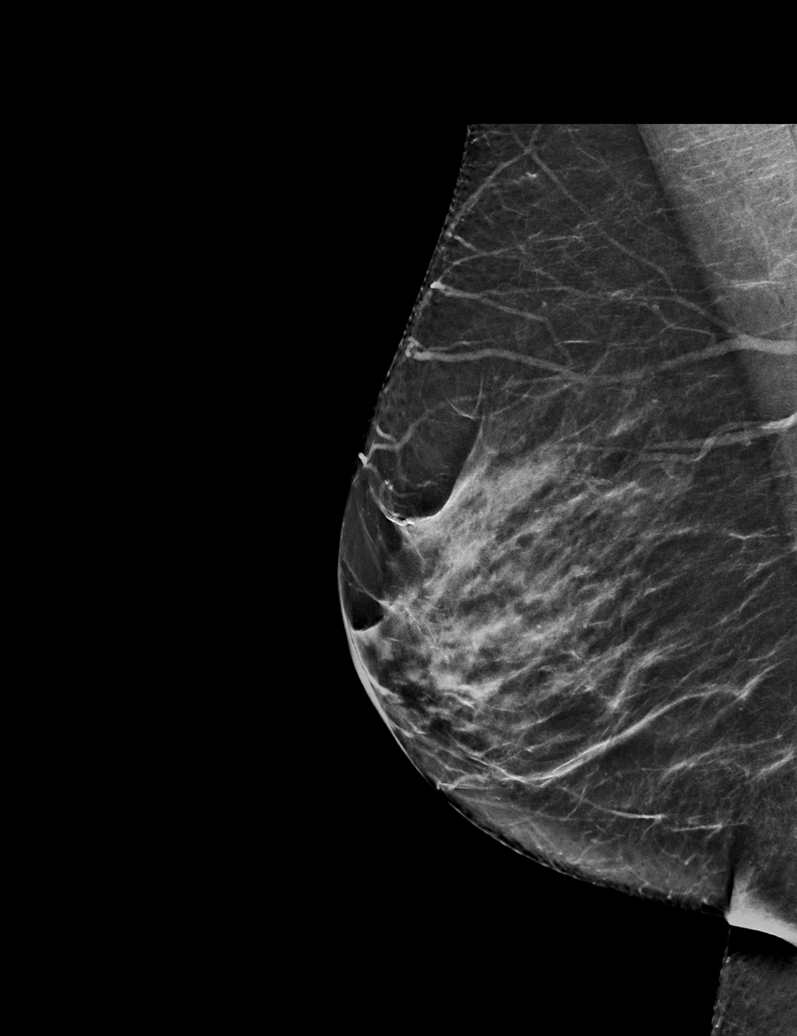

[R CC synth-2D]
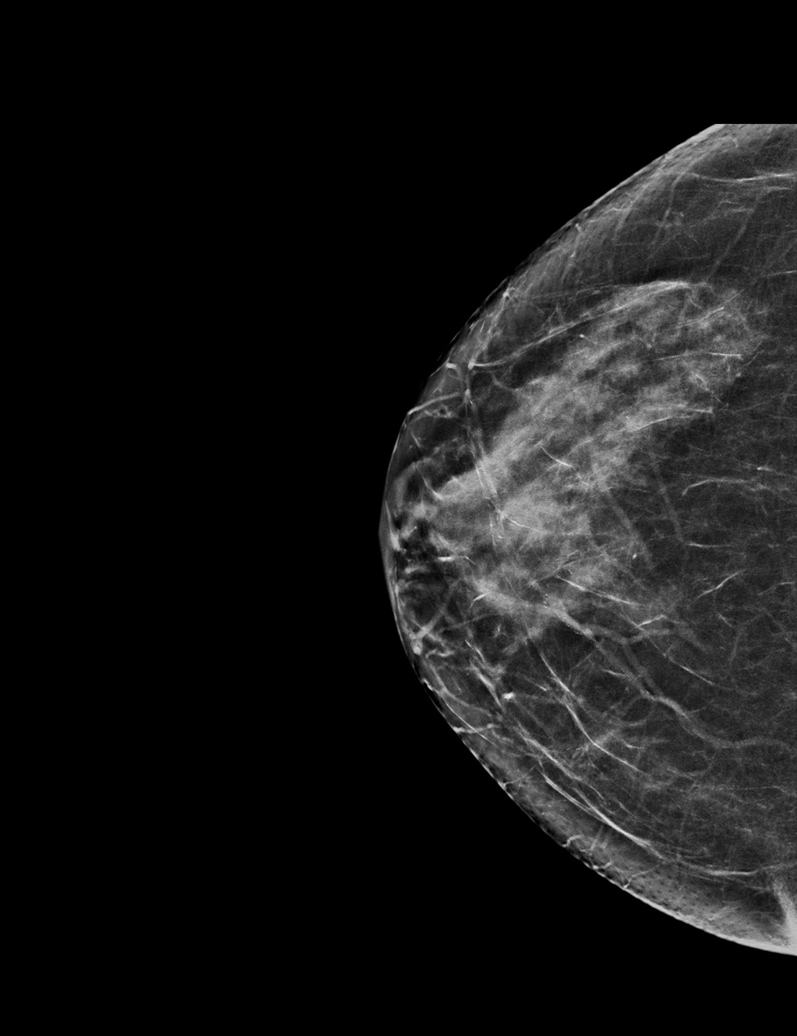

[L MLO synth-2D]
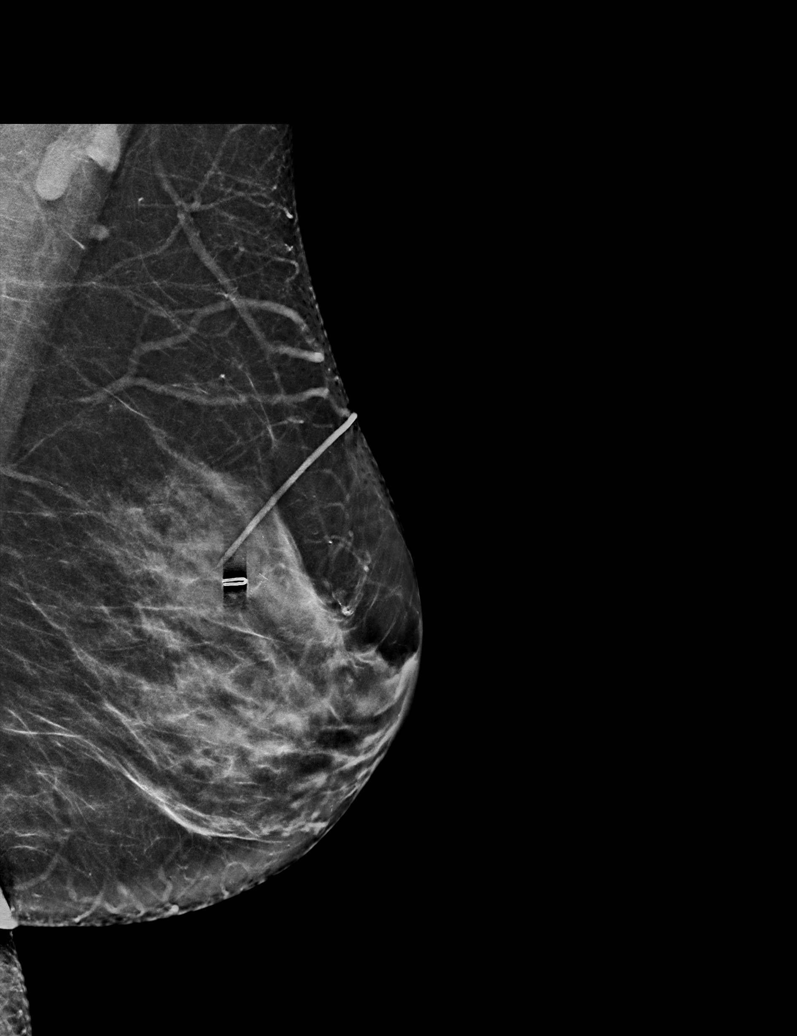

[R MLO tomo · tomo slice 32/63.0]
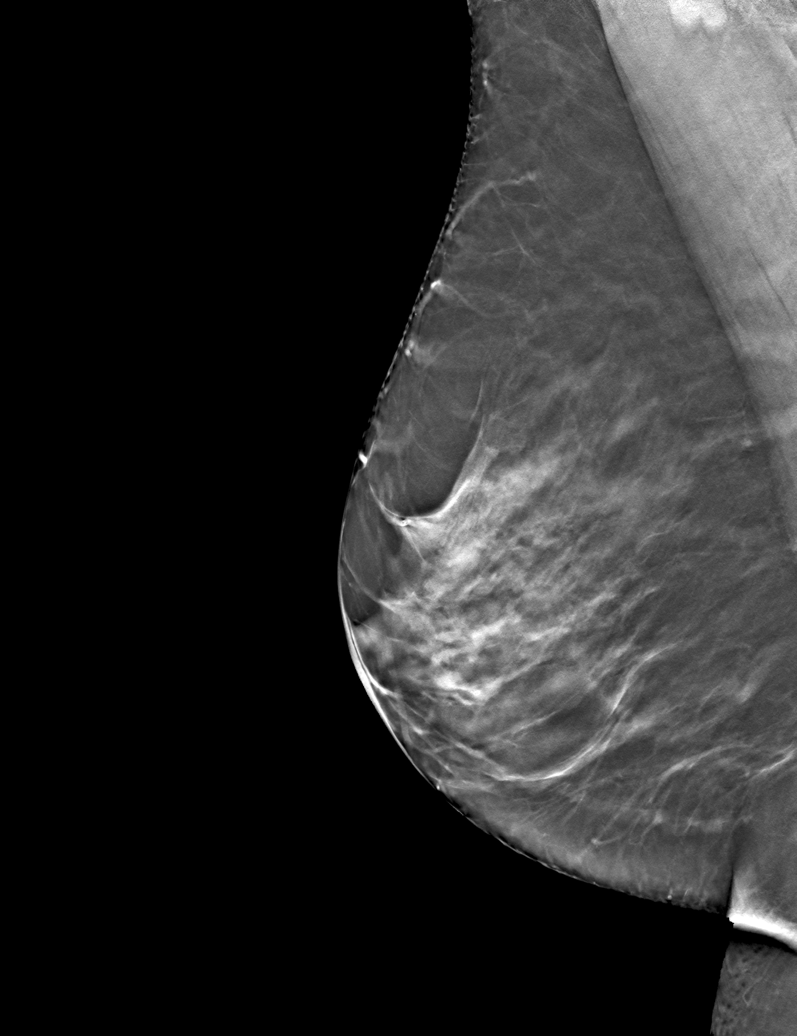

[6 of 30 positions shown; findings below may reference images not displayed]

ACR Breast Density Category c: The breast tissue is heterogeneously
dense, which may obscure small masses.
FINDINGS: There are no findings suspicious for malignancy.
IMPRESSION: No mammographic evidence of malignancy. A result letter of this
screening mammogram will be mailed directly to the patient.

RECOMMENDATION:
Screening mammogram in one year. (Code:Q3-W-BC3)

BI-RADS CATEGORY  1: Negative.

## 2024-04-15 ENCOUNTER — Other Ambulatory Visit (HOSPITAL_COMMUNITY): Payer: Self-pay | Admitting: Family Medicine

## 2024-04-15 DIAGNOSIS — F17210 Nicotine dependence, cigarettes, uncomplicated: Secondary | ICD-10-CM

## 2024-04-21 ENCOUNTER — Encounter (HOSPITAL_COMMUNITY): Payer: Self-pay

## 2024-04-21 ENCOUNTER — Ambulatory Visit (HOSPITAL_COMMUNITY)
Admission: RE | Admit: 2024-04-21 | Discharge: 2024-04-21 | Disposition: A | Discharge status: Home / Self Care | Source: Ambulatory Visit | Attending: Family Medicine | Admitting: Family Medicine

## 2024-04-21 DIAGNOSIS — Z1231 Encounter for screening mammogram for malignant neoplasm of breast: Secondary | ICD-10-CM | POA: Diagnosis present

## 2024-05-09 ENCOUNTER — Ambulatory Visit (HOSPITAL_COMMUNITY): Admission: RE | Admit: 2024-05-09 | Payer: Self-pay | Source: Ambulatory Visit

## 2024-05-09 ENCOUNTER — Encounter (HOSPITAL_COMMUNITY): Payer: Self-pay

## 2024-07-20 ENCOUNTER — Other Ambulatory Visit (HOSPITAL_COMMUNITY): Payer: Self-pay

## 2024-08-31 ENCOUNTER — Other Ambulatory Visit (HOSPITAL_COMMUNITY): Payer: Self-pay | Admitting: Family Medicine

## 2024-08-31 DIAGNOSIS — F17210 Nicotine dependence, cigarettes, uncomplicated: Secondary | ICD-10-CM

## 2024-09-07 ENCOUNTER — Ambulatory Visit (HOSPITAL_COMMUNITY)
Admission: RE | Admit: 2024-09-07 | Discharge: 2024-09-07 | Disposition: A | Discharge status: Home / Self Care | Source: Ambulatory Visit | Attending: Family Medicine | Admitting: Family Medicine

## 2024-09-07 DIAGNOSIS — F17210 Nicotine dependence, cigarettes, uncomplicated: Secondary | ICD-10-CM | POA: Insufficient documentation
